# Patient Record
Sex: Male | Born: 1960 | Race: White | Hispanic: Yes | Marital: Married | Smoking: Never smoker
Health system: Southern US, Community
[De-identification: ages and names within clinical notes are randomized; demographics above are authoritative.]

## PROBLEM LIST (undated history)

## (undated) DIAGNOSIS — K219 Gastro-esophageal reflux disease without esophagitis: Secondary | ICD-10-CM

## (undated) HISTORY — DX: Gastro-esophageal reflux disease without esophagitis: K21.9

---

## 2009-12-05 ENCOUNTER — Ambulatory Visit: Payer: Self-pay | Admitting: Internal Medicine

## 2009-12-05 DIAGNOSIS — R1013 Epigastric pain: Secondary | ICD-10-CM

## 2010-03-10 NOTE — Assessment & Plan Note (Signed)
Summary: NEW PT TO ESTABH/DLO   Vital Signs:  Patient profile:   50 year old male Height:      62 inches Weight:      139 pounds BMI:     25.52 Temp:     98.5 degrees F oral Pulse rate:   64 / minute Pulse rhythm:   regular BP sitting:   120 / 60  (left arm) Cuff size:   regular  Vitals Entered By: Mervin Hack CMA Duncan Dull) (December 05, 2009 11:46 AM) CC: new patient to establish care   History of Present Illness: Establishing here I have seen his wife for some time  Has had some sleep problems stress with work and schedule Has to take care of kids in evening and pushes his work later [employer] mild fatigue but no excessive daytime somnolence  Now with some indigestion Intermittent in the past, now more common in past year Gets "empty" feeling frequent light meals Food helps some No history of ulcer ONly occ heartburn (feels it in throat)---hasn't used meds  Preventive Screening-Counseling & Management  Alcohol-Tobacco     Smoking Status: never  Allergies (verified): No Known Drug Allergies  Past History:  Past Medical History: Unremarkable  Past Surgical History: Denies surgical history  Family History: Doesn't know Dad Mom fairly healthy. 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 Eyes:  Denies double vision and vision loss-1 eye. ENT:  Denies decreased hearing and ringing in ears; teeth okay--regular with dentist. CV:  Denies chest pain or discomfort, difficulty breathing at night, difficulty breathing while lying down, fainting, lightheadness, palpitations, and shortness of breath with exertion. Resp:  Complains of cough; denies shortness of breath; occ cough from  drip. GI:  See HPI; Complains of indigestion; denies bloody stools, change in bowel habits, and dark tarry stools. GU:  Denies dysuria, erectile dysfunction, urinary frequency, and urinary hesitancy; occ nocturia. MS:  Denies joint pain and joint swelling. Derm:  Denies lesion(s) and rash. Neuro:  Denies headaches, numbness, tingling, and weakness. Psych:  Denies anxiety and depression. Endo:  Denies cold intolerance, excessive thirst, and excessive urination. Heme:  Denies abnormal bruising and enlarge lymph nodes. Allergy:  Denies seasonal allergies and sneezing.  Physical Exam  General:  alert and normal appearance.   Eyes:  pupils equal, pupils round, pupils reactive to light, and no optic disk abnormalities.   Ears:  R ear normal and L ear normal.   Mouth:  no erythema and no exudates.   Neck:  supple, no masses, no thyromegaly, no carotid bruits, and no cervical lymphadenopathy.   Lungs:  normal respiratory effort, no intercostal retractions, no accessory muscle use, and normal breath sounds.   Heart:  normal rate, regular rhythm, no murmur, and no gallop.   Abdomen:  soft, non-tender, normal bowel sounds, and no masses.   No real tenderness even to deep palpatioin in epigastrium Msk:  no joint tenderness and no joint swelling.   Pulses:  Normal in feet Extremities:  no edema Neurologic:  alert & oriented X3, strength normal in all extremities, and gait normal.   Skin:  no rashes and no suspicious lesions.   Axillary Nodes:  No palpable lymphadenopathy Psych:  normally interactive, good eye contact, not anxious appearing, and not depressed appearing.     Impression & Recommendations:  Problem # 1:  PREVENTIVE HEALTH CARE (ICD-V70.0) Assessment Comment Only Healthy counselling done Tdap  Problem # 2:  ABDOMINAL PAIN, EPIGASTRIC (ICD-789.06) Assessment: New mild and not quite clear cut but some suggestion of possible ulcer will treat with omeprazole for 2-6 weeks check  labs including H. Pylori  Other Orders: Tdap => 26yrs IM (78295) Admin 1st Vaccine (62130)  Patient Instructions: 1)  Please try omeprazole 20mg  daily (at least 30 minutes before eating) for 2-6 weeks 2)  Please get labs to [facility] 3)  CBC with diff, met C, TSH, H. pylori  789.06 4)  Please schedule a follow-up appointment in 1-2 years   Orders Added: 1)  New Patient 40-64 years [99386] 2)  Tdap => 77yrs IM [[id number]] 3)  Admin 1st Vaccine [[id number]]   Immunizations Administered:  Tetanus Vaccine:    Vaccine Type: Tdap    Site: left deltoid    Mfr: GlaxoSmithKline    Dose: 0.5 ml    Route: IM    Given by: Mervin Hack CMA (AAMA)    Exp. Date: 11/28/2011    Lot #: [id number]    VIS given: 12/27/07 version given December 05, 2009.   Immunizations Administered:  Tetanus Vaccine:    Vaccine Type: Tdap    Site: left deltoid    Mfr: GlaxoSmithKline    Dose: 0.5 ml    Route: IM    Given by: Mervin Hack CMA (AAMA)    Exp. Date: 11/28/2011    Lot #: [id number]    VIS given: 12/27/07 version given December 05, 2009.  Prior Medications: None Current Allergies (reviewed today): No known allergies

## 2011-03-12 ENCOUNTER — Encounter: Payer: Self-pay | Admitting: Internal Medicine

## 2011-03-12 ENCOUNTER — Ambulatory Visit (INDEPENDENT_AMBULATORY_CARE_PROVIDER_SITE_OTHER): Payer: 59 | Admitting: Internal Medicine

## 2011-03-12 VITALS — BP 110/60 | HR 57 | Temp 97.6°F | Ht 64.0 in | Wt 136.0 lb

## 2011-03-12 DIAGNOSIS — K219 Gastro-esophageal reflux disease without esophagitis: Secondary | ICD-10-CM

## 2011-03-12 DIAGNOSIS — Z0000 Encounter for general adult medical examination without abnormal findings: Secondary | ICD-10-CM | POA: Insufficient documentation

## 2011-03-12 NOTE — Progress Notes (Signed)
Subjective:    Patient ID: Shane Hanna, male    DOB: Jan 16, 1961, 51 y.o.   MRN: [id number]  HPI Has been doing well He feels his stomach is better Uses zantac prn May have some "feeling" including heartburn---if he eats irregularly  Exercising through [employer] program Trying to eat right  No current outpatient prescriptions on file prior to visit.    No Known Allergies  Past Medical History  Diagnosis Date  . GERD (gastroesophageal reflux disease)     No past surgical history on file.  Family History  Problem Relation Age of Onset  . Coronary artery disease Neg Hx   . Diabetes Neg Hx   . Hypertension Neg Hx   . Cancer Neg Hx     History   Social History  . Marital Status: Married    Spouse Name: N/A    Number of Children: 3  . Years of Education: N/A   Occupational History  . Merchandiser, retail for [employer]   Social History Main Topics  . Smoking status: Never Smoker   . Smokeless tobacco: Never Used  . Alcohol Use: No  . Drug Use: No  . Sexually Active: Not on file   Other Topics Concern  . Not on file   Social History Narrative  . No narrative on file   Review of Systems  Constitutional: Negative for fatigue and unexpected weight change.       Wears seat belt  HENT: Negative for hearing loss, congestion, rhinorrhea, dental problem and tinnitus.        Regular with dentist  Eyes: Negative for visual disturbance.       No diplopia or unilateral vision loss  Respiratory: Positive for cough. Negative for chest tightness and shortness of breath.        Has had some cough---then brief sense of not being able to catch breath---better with sigh  Cardiovascular: Negative for chest pain, palpitations and leg swelling.  Gastrointestinal: Positive for nausea. Negative for vomiting, abdominal pain, constipation and blood in stool.       Occ nausea---associated with reflux  Genitourinary: Negative for dysuria, urgency and difficulty urinating.       No  sexual problems  Musculoskeletal: Negative for back pain, joint swelling and arthralgias.  Skin: Negative for rash.       No suspicious lesions  Neurological: Negative for dizziness, syncope, weakness, light-headedness, numbness and headaches.  Hematological: Negative for adenopathy. Does not bruise/bleed easily.  Psychiatric/Behavioral: Positive for sleep disturbance. Negative for dysphoric mood. The patient is not nervous/anxious.        Some trouble sleeping--relates to work stress. Some trouble initiating and staying asleep. Some daytime fatigue No caffeine       Objective:   Physical Exam  Constitutional: He is oriented to person, place, and time. He appears well-developed and well-nourished. No distress.  HENT:  Head: Normocephalic and atraumatic.  Right Ear: External ear normal.  Left Ear: External ear normal.  Mouth/Throat: Oropharynx is clear and moist. No oropharyngeal exudate.       TMs normal  Eyes: Conjunctivae and EOM are normal. Pupils are equal, round, and reactive to light.  Neck: Normal range of motion. Neck supple. No thyromegaly present.  Cardiovascular: Normal rate, regular rhythm, normal heart sounds and intact distal pulses.  Exam reveals no gallop.   No murmur heard. Pulmonary/Chest: Effort normal and breath sounds normal. No respiratory distress. He has no wheezes. He has no rales.  Abdominal: Soft. There is  no tenderness.  Musculoskeletal: Normal range of motion. He exhibits no edema and no tenderness.  Lymphadenopathy:    He has no cervical adenopathy.  Neurological: He is alert and oriented to person, place, and time.  Skin: No rash noted. No erythema.  Psychiatric: He has a normal mood and affect. His behavior is normal. Judgment and thought content normal.          Assessment & Plan:

## 2011-03-12 NOTE — Assessment & Plan Note (Signed)
Healthy Working on fitness Counseling done Will check lipid and glucose

## 2011-03-12 NOTE — Assessment & Plan Note (Signed)
occ symptoms Ranitidine prn

## 2011-03-13 LAB — LIPID PANEL
HDL: 40 mg/dL (ref >39)
Triglycerides: 203 mg/dL — ABNORMAL HIGH (ref 0–149)

## 2017-07-26 ENCOUNTER — Telehealth: Payer: Self-pay | Admitting: Urgent Care

## 2017-07-26 ENCOUNTER — Ambulatory Visit: Payer: Self-pay | Admitting: Urgent Care

## 2017-07-26 NOTE — Telephone Encounter (Signed)
Pt came in 24 minutes past his appt time. He told me to reschedule it but left before I could reschedule it.

## 2017-10-31 ENCOUNTER — Other Ambulatory Visit: Payer: Self-pay | Admitting: Family Medicine

## 2017-10-31 DIAGNOSIS — N62 Hypertrophy of breast: Secondary | ICD-10-CM

## 2017-10-31 DIAGNOSIS — D171 Benign lipomatous neoplasm of skin and subcutaneous tissue of trunk: Secondary | ICD-10-CM

## 2017-11-10 ENCOUNTER — Other Ambulatory Visit: Payer: BLUE CROSS/BLUE SHIELD

## 2017-11-30 ENCOUNTER — Encounter: Payer: Self-pay | Admitting: Internal Medicine

## 2017-12-27 ENCOUNTER — Ambulatory Visit: Payer: Self-pay | Admitting: Internal Medicine

## 2018-07-24 ENCOUNTER — Other Ambulatory Visit: Payer: Self-pay | Admitting: Surgery

## 2018-07-24 DIAGNOSIS — N62 Hypertrophy of breast: Secondary | ICD-10-CM

## 2018-07-28 ENCOUNTER — Ambulatory Visit
Admission: RE | Admit: 2018-07-28 | Discharge: 2018-07-28 | Disposition: A | Discharge status: Home / Self Care | Payer: BC Managed Care – PPO | Source: Ambulatory Visit | Attending: Surgery | Admitting: Surgery

## 2018-07-28 ENCOUNTER — Other Ambulatory Visit: Payer: Self-pay

## 2018-07-28 DIAGNOSIS — N62 Hypertrophy of breast: Secondary | ICD-10-CM | POA: Diagnosis not present

## 2018-07-31 ENCOUNTER — Ambulatory Visit: Payer: Self-pay | Admitting: Surgery

## 2018-07-31 NOTE — H&P (Signed)
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 male who was referred by Toni Arthurs,* for evaluation of above. First noted several months ago.  Symptoms include: Pain is intermittent, with palpation.  Exacerbated by palpation.  Alleviated by nothing specific.  Associated with visible enlargement.  First noticed gynecomastia after losing some weight.  Endocrine workup negative, and tx with tamoxifen has relieved some, but not all pain.  The chest wall lipoma was noted after dropping a dumbbell in the area.  No overlying skin changes, but is tender on palpation.    Denies any testicular mass, lesion, or pain.   Past Medical History:  has a past medical history of Colon polyp and Gynecomastia.  Past Surgical History:  has a past surgical history that includes wisdom teeth removal (1987) and Colonoscopy (12/14/2017).  Family History: family history includes Myocardial Infarction (Heart attack) in his maternal grandfather; No Known Problems in his brother; Urinary tract infection in his mother.  Social History:  reports that he has never smoked. He has never used smokeless tobacco. He reports that he does not drink alcohol or use drugs.  Current Medications: has a current medication list which includes the following prescription(s): multivitamin  Allergies:  No Known Allergies  ROS:  A 15 point review of systems was performed and pertinent positives and negatives noted in HPI   Objective:   BP 124/75   Pulse (!) 49   Ht 157.5 cm (5\' 2" )   Wt 66.7 kg (147 lb)   BMI 26.89 kg/m   Constitutional :  alert, appears stated age, cooperative and no distress  Lymphatics/Throat:  no asymmetry, masses, or scars  Respiratory:  clear to auscultation bilaterally  Cardiovascular:  regular rate and rhythm  Gastrointestinal: soft, non-tender; bowel sounds normal; no masses,  no  organomegaly.  Easily visible, small incarcerated, but non-tender umbilical hernia  Musculoskeletal: Steady gait and movement  Skin: Cool and moist  Psychiatric: Normal affect, non-agitated, not confused  Breast: Bilatera, moderate size gynecomastia with no concerning palpable lumps, no overlying skin changes.  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 Gynecomastia, male [N62] Discussed surgical excision.  Alternatives include continued observation.  Benefits include possible symptom relief, pathologic evaluation, improved cosmesis. Discussed the risk of surgery including recurrence, chronic pain, post-op infxn, poor cosmesis, poor/delayed wound healing, and possible re-operation to address said risks. The risks of general anesthetic, if used, includes MI, CVA, sudden death or even reaction to anesthetic medications also discussed.  Typical post-op recovery time of 3-5 days with possible activity restrictions were also discussed.  The patient verbalized understanding and all questions were answered to the patient's satisfaction.  2. Patient has elected to proceed with surgical treatment, understanding this is for symptoms relief and no guarantee of cosmetic outcome.   Procedure will be scheduled after mammogram done and confirm no suspicious lesions.  Will perform lipoma removal at same time.  3.  Discussed umbilical hernia pathology and possible surgical intervention.  Will discuss in detail again once recovered from surgery above.  Written consent was obtained.     Electronically signed by Benjamine Sprague, DO on 07/24/2018 9:53 AM

## 2018-07-31 NOTE — H&P (View-Only) (Signed)
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 male who was referred by Toni Arthurs,* for evaluation of above. First noted several months ago.  Symptoms include: Pain is intermittent, with palpation.  Exacerbated by palpation.  Alleviated by nothing specific.  Associated with visible enlargement.  First noticed gynecomastia after losing some weight.  Endocrine workup negative, and tx with tamoxifen has relieved some, but not all pain.  The chest wall lipoma was noted after dropping a dumbbell in the area.  No overlying skin changes, but is tender on palpation.    Denies any testicular mass, lesion, or pain.   Past Medical History:  has a past medical history of Colon polyp and Gynecomastia.  Past Surgical History:  has a past surgical history that includes wisdom teeth removal (1987) and Colonoscopy (12/14/2017).  Family History: family history includes Myocardial Infarction (Heart attack) in his maternal grandfather; No Known Problems in his brother; Urinary tract infection in his mother.  Social History:  reports that he has never smoked. He has never used smokeless tobacco. He reports that he does not drink alcohol or use drugs.  Current Medications: has a current medication list which includes the following prescription(s): multivitamin  Allergies:  No Known Allergies  ROS:  A 15 point review of systems was performed and pertinent positives and negatives noted in HPI   Objective:   BP 124/75   Pulse (!) 49   Ht 157.5 cm (5\' 2" )   Wt 66.7 kg (147 lb)   BMI 26.89 kg/m   Constitutional :  alert, appears stated age, cooperative and no distress  Lymphatics/Throat:  no asymmetry, masses, or scars  Respiratory:  clear to auscultation bilaterally  Cardiovascular:  regular rate and rhythm  Gastrointestinal: soft, non-tender; bowel sounds normal; no masses,  no  organomegaly.  Easily visible, small incarcerated, but non-tender umbilical hernia  Musculoskeletal: Steady gait and movement  Skin: Cool and moist  Psychiatric: Normal affect, non-agitated, not confused  Breast: Bilatera, moderate size gynecomastia with no concerning palpable lumps, no overlying skin changes.  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 Gynecomastia, male [N62] Discussed surgical excision.  Alternatives include continued observation.  Benefits include possible symptom relief, pathologic evaluation, improved cosmesis. Discussed the risk of surgery including recurrence, chronic pain, post-op infxn, poor cosmesis, poor/delayed wound healing, and possible re-operation to address said risks. The risks of general anesthetic, if used, includes MI, CVA, sudden death or even reaction to anesthetic medications also discussed.  Typical post-op recovery time of 3-5 days with possible activity restrictions were also discussed.  The patient verbalized understanding and all questions were answered to the patient's satisfaction.  2. Patient has elected to proceed with surgical treatment, understanding this is for symptoms relief and no guarantee of cosmetic outcome.   Procedure will be scheduled after mammogram done and confirm no suspicious lesions.  Will perform lipoma removal at same time.  3.  Discussed umbilical hernia pathology and possible surgical intervention.  Will discuss in detail again once recovered from surgery above.  Written consent was obtained.     Electronically signed by Benjamine Sprague, DO on 07/24/2018 9:53 AM

## 2018-08-02 ENCOUNTER — Other Ambulatory Visit: Payer: Self-pay

## 2018-08-02 ENCOUNTER — Encounter
Admission: RE | Admit: 2018-08-02 | Discharge: 2018-08-02 | Disposition: A | Discharge status: Home / Self Care | Payer: BC Managed Care – PPO | Source: Ambulatory Visit | Attending: Surgery | Admitting: Surgery

## 2018-08-02 NOTE — Patient Instructions (Signed)
Your procedure is scheduled on:08/08/18 Report to Day Surgery. [facility]OND [facility]LOOR To find out your arrival time please call [phone] between 1PM - 3PM on  08/07/18.  Remember: Instructions that are not followed completely may result in serious medical risk,  up to and including death, or upon the discretion of your surgeon and anesthesiologist your  surgery may need to be rescheduled.     _X__ 1. Do not eat food after midnight the night before your procedure.                 No gum chewing or hard candies. You may drink clear liquids up to 2 hours                 before you are scheduled to arrive for your surgery- DO not drink clear                 liquids within 2 hours of the start of your surgery.                 Clear Liquids include:  water, apple juice without pulp, clear carbohydrate                 drink such as Clearfast of Gatorade, Black Coffee or Tea (Do not add                 anything to coffee or tea).  __X__2.  On the morning of surgery brush your teeth with toothpaste and water, you                may rinse your mouth with mouthwash if you wish.  Do not swallow any toothpaste of mouthwash.     _X__ 3.  No Alcohol for 24 hours before or after surgery.   _X__ 4.  Do Not Smoke or use e-cigarettes For 24 Hours Prior to Your Surgery.                 Do not use any chewable tobacco products for at least 6 hours prior to                 surgery.  ____  5.  Bring all medications with you on the day of surgery if instructed.   __X__  6.  Notify your doctor if there is any change in your medical condition      (cold, fever, infections).     Do not wear jewelry, make-up, hairpins, clips or nail polish. Do not wear lotions, powders, or perfumes. You may wear deodorant. Do not shave 48 hours prior to surgery. Men may shave face and neck. Do not bring valuables to the hospital.    [facility] is not responsible for any belongings or  valuables.  Contacts, dentures or bridgework may not be worn into surgery. Leave your suitcase in the car. After surgery it may be brought to your room. For patients admitted to the hospital, discharge time is determined by your treatment team.   Patients discharged the day of surgery will not be allowed to driVE           ____ Take these medicines the morning of surgery with A SIP OF WATER:    1. NONE  2.   3.   4.  5.  6.  ____ Fleet Enema (as directed)   ____ Use CHG Soap as directed  ____ Use inhalers on the day of surgery  ____ Stop metformin  2 days prior to surgery    ____ Take 1/2 of usual insulin dose the night before surgery. No insulin the morning          of surgery.   ____ Stop Coumadin/Plavix/aspirin on   ____ Stop Anti-inflammatories on    ____ Stop supplements until after surgery.    ____ Bring C-Pap to the hospital.

## 2018-08-04 ENCOUNTER — Other Ambulatory Visit
Admission: RE | Admit: 2018-08-04 | Discharge: 2018-08-04 | Disposition: A | Discharge status: Home / Self Care | Payer: BC Managed Care – PPO | Source: Ambulatory Visit | Attending: Surgery | Admitting: Surgery

## 2018-08-04 ENCOUNTER — Other Ambulatory Visit: Payer: Self-pay

## 2018-08-04 DIAGNOSIS — Z1159 Encounter for screening for other viral diseases: Secondary | ICD-10-CM | POA: Diagnosis not present

## 2018-08-05 LAB — NOVEL CORONAVIRUS, NAA (HOSP ORDER, SEND-OUT TO REF LAB; TAT 18-24 HRS): SARS-CoV-2, NAA: NOT DETECTED

## 2018-08-07 ENCOUNTER — Encounter: Payer: Self-pay | Admitting: Anesthesiology

## 2018-08-07 MED ORDER — CEFAZOLIN SODIUM-DEXTROSE 2-4 GM/100ML-% IV SOLN
2.0000 g | INTRAVENOUS | Status: AC
  Administered 2018-08-08: 2 g via INTRAVENOUS

## 2018-08-08 ENCOUNTER — Ambulatory Visit: Payer: BC Managed Care – PPO | Admitting: Anesthesiology

## 2018-08-08 ENCOUNTER — Encounter: Payer: Self-pay | Admitting: *Deleted

## 2018-08-08 ENCOUNTER — Ambulatory Visit
Admission: RE | Admit: 2018-08-08 | Discharge: 2018-08-08 | Disposition: A | Discharge status: Home / Self Care | Payer: BC Managed Care – PPO | Source: Ambulatory Visit | Attending: Surgery | Admitting: Surgery

## 2018-08-08 ENCOUNTER — Other Ambulatory Visit: Payer: Self-pay

## 2018-08-08 ENCOUNTER — Encounter
Admission: RE | Disposition: A | Discharge status: Home / Self Care | Payer: Self-pay | Source: Ambulatory Visit | Attending: Surgery

## 2018-08-08 DIAGNOSIS — D171 Benign lipomatous neoplasm of skin and subcutaneous tissue of trunk: Secondary | ICD-10-CM | POA: Insufficient documentation

## 2018-08-08 DIAGNOSIS — K420 Umbilical hernia with obstruction, without gangrene: Secondary | ICD-10-CM | POA: Diagnosis not present

## 2018-08-08 DIAGNOSIS — N62 Hypertrophy of breast: Secondary | ICD-10-CM | POA: Diagnosis not present

## 2018-08-08 HISTORY — PX: GYNECOMASTIA MASTECTOMY: SHX5265

## 2018-08-08 HISTORY — PX: LIPOMA EXCISION: SHX5283

## 2018-08-08 SURGERY — MASTECTOMY, FOR GYNECOMASTIA
Anesthesia: General | Laterality: Bilateral

## 2018-08-08 MED ORDER — HYDROCODONE-ACETAMINOPHEN 5-325 MG PO TABS
1.0000 | ORAL_TABLET | Freq: Once | ORAL | Status: AC
  Administered 2018-08-08: 14:00:00 1 via ORAL

## 2018-08-08 MED ORDER — HYDROCODONE-ACETAMINOPHEN 5-325 MG PO TABS: 1.0000 | ORAL_TABLET | Freq: Four times a day (QID) | ORAL | 0 refills | Status: AC | PRN

## 2018-08-08 MED ORDER — CELECOXIB 200 MG PO CAPS
ORAL_CAPSULE | ORAL | Status: AC
  Administered 2018-08-08: 09:00:00 200 mg via ORAL
  Filled 2018-08-08: qty 1

## 2018-08-08 MED ORDER — ACETAMINOPHEN 500 MG PO TABS
1000.0000 mg | ORAL_TABLET | ORAL | Status: AC
  Administered 2018-08-08: 09:00:00 1000 mg via ORAL

## 2018-08-08 MED ORDER — ACETAMINOPHEN 500 MG PO TABS
ORAL_TABLET | ORAL | Status: AC
  Administered 2018-08-08: 1000 mg via ORAL
  Filled 2018-08-08: qty 2

## 2018-08-08 MED ORDER — GABAPENTIN 300 MG PO CAPS
ORAL_CAPSULE | ORAL | Status: AC
  Administered 2018-08-08: 09:00:00 300 mg via ORAL
  Filled 2018-08-08: qty 1

## 2018-08-08 MED ORDER — IBUPROFEN 800 MG PO TABS: 800.0000 mg | ORAL_TABLET | Freq: Three times a day (TID) | ORAL | 0 refills | Status: DC | PRN

## 2018-08-08 MED ORDER — FENTANYL CITRATE (PF) 100 MCG/2ML IJ SOLN: 25.0000 ug | INTRAMUSCULAR | Status: DC | PRN

## 2018-08-08 MED ORDER — GABAPENTIN 300 MG PO CAPS
300.0000 mg | ORAL_CAPSULE | ORAL | Status: AC
  Administered 2018-08-08: 09:00:00 300 mg via ORAL

## 2018-08-08 MED ORDER — FAMOTIDINE 20 MG PO TABS
20.0000 mg | ORAL_TABLET | Freq: Once | ORAL | Status: AC
  Administered 2018-08-08: 09:00:00 20 mg via ORAL

## 2018-08-08 MED ORDER — MIDAZOLAM HCL 2 MG/2ML IJ SOLN
INTRAMUSCULAR | Status: AC
  Filled 2018-08-08: qty 2

## 2018-08-08 MED ORDER — LIDOCAINE HCL (PF) 1 % IJ SOLN
INTRAMUSCULAR | Status: AC
  Filled 2018-08-08: qty 30

## 2018-08-08 MED ORDER — BUPIVACAINE-EPINEPHRINE (PF) 0.5% -1:200000 IJ SOLN
INTRAMUSCULAR | Status: AC
  Filled 2018-08-08: qty 30

## 2018-08-08 MED ORDER — FENTANYL CITRATE (PF) 100 MCG/2ML IJ SOLN
INTRAMUSCULAR | Status: DC | PRN
  Administered 2018-08-08 (×4): 25 ug via INTRAVENOUS

## 2018-08-08 MED ORDER — GLYCOPYRROLATE 0.2 MG/ML IJ SOLN
INTRAMUSCULAR | Status: AC
  Filled 2018-08-08: qty 1

## 2018-08-08 MED ORDER — PROPOFOL 10 MG/ML IV BOLUS
INTRAVENOUS | Status: AC
  Filled 2018-08-08: qty 20

## 2018-08-08 MED ORDER — ONDANSETRON HCL 4 MG/2ML IJ SOLN
INTRAMUSCULAR | Status: DC | PRN
  Administered 2018-08-08: 4 mg via INTRAVENOUS

## 2018-08-08 MED ORDER — MIDAZOLAM HCL 2 MG/2ML IJ SOLN
INTRAMUSCULAR | Status: DC | PRN
  Administered 2018-08-08: 2 mg via INTRAVENOUS

## 2018-08-08 MED ORDER — HYDROCODONE-ACETAMINOPHEN 5-325 MG PO TABS
ORAL_TABLET | ORAL | Status: AC
  Administered 2018-08-08: 1 via ORAL
  Filled 2018-08-08: qty 1

## 2018-08-08 MED ORDER — LACTATED RINGERS IV SOLN
INTRAVENOUS | Status: DC
  Administered 2018-08-08 (×2): via INTRAVENOUS

## 2018-08-08 MED ORDER — CELECOXIB 200 MG PO CAPS
200.0000 mg | ORAL_CAPSULE | ORAL | Status: AC
  Administered 2018-08-08: 09:00:00 200 mg via ORAL

## 2018-08-08 MED ORDER — CEFAZOLIN SODIUM-DEXTROSE 2-4 GM/100ML-% IV SOLN
INTRAVENOUS | Status: AC
  Filled 2018-08-08: qty 100

## 2018-08-08 MED ORDER — BUPIVACAINE HCL (PF) 0.5 % IJ SOLN
INTRAMUSCULAR | Status: DC | PRN
  Administered 2018-08-08: 12.5 mL

## 2018-08-08 MED ORDER — DOCUSATE SODIUM 100 MG PO CAPS: 100.0000 mg | ORAL_CAPSULE | Freq: Two times a day (BID) | ORAL | 0 refills | Status: AC | PRN

## 2018-08-08 MED ORDER — LIDOCAINE HCL 1 % IJ SOLN
INTRAMUSCULAR | Status: DC | PRN
  Administered 2018-08-08: 12.5 mL

## 2018-08-08 MED ORDER — ONDANSETRON HCL 4 MG/2ML IJ SOLN: 4.0000 mg | Freq: Once | INTRAMUSCULAR | Status: DC | PRN

## 2018-08-08 MED ORDER — FENTANYL CITRATE (PF) 100 MCG/2ML IJ SOLN
INTRAMUSCULAR | Status: AC
  Filled 2018-08-08: qty 2

## 2018-08-08 MED ORDER — LIDOCAINE-EPINEPHRINE (PF) 1 %-1:200000 IJ SOLN
INTRAMUSCULAR | Status: AC
  Filled 2018-08-08: qty 30

## 2018-08-08 MED ORDER — LIDOCAINE HCL (CARDIAC) PF 100 MG/5ML IV SOSY
PREFILLED_SYRINGE | INTRAVENOUS | Status: DC | PRN
  Administered 2018-08-08: 60 mg via INTRAVENOUS

## 2018-08-08 MED ORDER — FAMOTIDINE 20 MG PO TABS
ORAL_TABLET | ORAL | Status: AC
  Administered 2018-08-08: 20 mg via ORAL
  Filled 2018-08-08: qty 1

## 2018-08-08 MED ORDER — GLYCOPYRROLATE 0.2 MG/ML IJ SOLN
INTRAMUSCULAR | Status: DC | PRN
  Administered 2018-08-08 (×2): 0.2 mg via INTRAVENOUS

## 2018-08-08 MED ORDER — ONDANSETRON HCL 4 MG/2ML IJ SOLN
INTRAMUSCULAR | Status: AC
  Filled 2018-08-08: qty 2

## 2018-08-08 MED ORDER — ACETAMINOPHEN 325 MG PO TABS: 650.0000 mg | ORAL_TABLET | Freq: Three times a day (TID) | ORAL | 0 refills | Status: AC | PRN

## 2018-08-08 MED ORDER — CHLORHEXIDINE GLUCONATE CLOTH 2 % EX PADS: 6.0000 | MEDICATED_PAD | Freq: Once | CUTANEOUS | Status: DC

## 2018-08-08 MED ORDER — PROPOFOL 10 MG/ML IV BOLUS
INTRAVENOUS | Status: DC | PRN
  Administered 2018-08-08: 80 mg via INTRAVENOUS
  Administered 2018-08-08: 120 mg via INTRAVENOUS

## 2018-08-08 SURGICAL SUPPLY — 46 items
APPLIER CLIP 11 MED OPEN (CLIP)
BLADE SURG 15 STRL LF DISP TIS (BLADE) ×1 IMPLANT
BLADE SURG 15 STRL SS (BLADE) ×2
BULB RESERV EVAC DRAIN JP 100C (MISCELLANEOUS) IMPLANT
CANISTER SUCT 1200ML W/VALVE (MISCELLANEOUS) ×3 IMPLANT
CHLORAPREP W/TINT 26 (MISCELLANEOUS) ×3 IMPLANT
CLIP APPLIE 11 MED OPEN (CLIP) IMPLANT
COVER WAND RF STERILE (DRAPES) ×3 IMPLANT
DERMABOND ADVANCED (GAUZE/BANDAGES/DRESSINGS) ×6
DERMABOND ADVANCED .7 DNX12 (GAUZE/BANDAGES/DRESSINGS) ×1 IMPLANT
DRAIN CHANNEL JP 15F RND 16 (MISCELLANEOUS) IMPLANT
DRAPE LAPAROTOMY 100X77 ABD (DRAPES) ×1 IMPLANT
DRAPE LAPAROTOMY TRNSV 106X77 (MISCELLANEOUS) ×3 IMPLANT
DRAPE SHEET LG 3/4 BI-LAMINATE (DRAPES) ×3 IMPLANT
ELECT CAUTERY BLADE 6.4 (BLADE) ×3 IMPLANT
ELECT CAUTERY BLADE TIP 2.5 (TIP) ×3
ELECT REM PT RETURN 9FT ADLT (ELECTROSURGICAL) ×3
ELECTRODE CAUTERY BLDE TIP 2.5 (TIP) ×1 IMPLANT
ELECTRODE REM PT RTRN 9FT ADLT (ELECTROSURGICAL) ×1 IMPLANT
GLOVE BIOGEL PI IND STRL 7.0 (GLOVE) ×1 IMPLANT
GLOVE BIOGEL PI INDICATOR 7.0 (GLOVE) ×2
GLOVE SURG SYN 6.5 ES PF (GLOVE) ×3 IMPLANT
GLOVE SURG SYN 6.5 PF PI (GLOVE) ×1 IMPLANT
GOWN STRL REUS W/ TWL LRG LVL3 (GOWN DISPOSABLE) ×2 IMPLANT
GOWN STRL REUS W/TWL LRG LVL3 (GOWN DISPOSABLE) ×4
KIT TURNOVER KIT A (KITS) ×3 IMPLANT
LABEL OR SOLS (LABEL) ×3 IMPLANT
LIGHT WAVEGUIDE WIDE FLAT (MISCELLANEOUS) IMPLANT
NEEDLE HYPO 22GX1.5 SAFETY (NEEDLE) ×3 IMPLANT
NS IRRIG 1000ML POUR BTL (IV SOLUTION) ×3 IMPLANT
PACK BASIN MINOR ARMC (MISCELLANEOUS) ×3 IMPLANT
SPONGE LAP 18X18 RF (DISPOSABLE) ×3 IMPLANT
SUT ETHILON 3-0 FS-10 30 BLK (SUTURE)
SUT MNCRL 4-0 (SUTURE) ×2
SUT MNCRL 4-0 27XMFL (SUTURE) ×1
SUT SILK 2 0 (SUTURE) ×2
SUT SILK 2 0 SH (SUTURE) IMPLANT
SUT SILK 2-0 30XBRD TIE 12 (SUTURE) ×1 IMPLANT
SUT SILK 3 0 12 30 (SUTURE) ×3 IMPLANT
SUT VIC AB 3-0 SH 27 (SUTURE) ×2
SUT VIC AB 3-0 SH 27X BRD (SUTURE) ×1 IMPLANT
SUTURE EHLN 3-0 FS-10 30 BLK (SUTURE) IMPLANT
SUTURE MNCRL 4-0 27XMF (SUTURE) ×1 IMPLANT
SYR 20CC LL (SYRINGE) ×3 IMPLANT
SYR 30ML LL (SYRINGE) ×3 IMPLANT
TOWEL OR 17X26 4PK STRL BLUE (TOWEL DISPOSABLE) ×3 IMPLANT

## 2018-08-08 NOTE — Anesthesia Preprocedure Evaluation (Signed)
Anesthesia Evaluation  Patient identified by MRN, date of birth, ID band Patient awake    Reviewed: Allergy & Precautions, NPO status , Patient's Chart, lab work & pertinent test results, reviewed documented beta blocker date and time   Airway Mallampati: III  TM Distance: >3 FB     Dental  (+) Chipped   Pulmonary           Cardiovascular      Neuro/Psych    GI/Hepatic GERD  Controlled,  Endo/Other    Renal/GU      Musculoskeletal   Abdominal   Peds  Hematology   Anesthesia Other Findings   Reproductive/Obstetrics                             Anesthesia Physical Anesthesia Plan  ASA: III  Anesthesia Plan: General   Post-op Pain Management:    Induction: Intravenous  PONV Risk Score and Plan:   Airway Management Planned: Oral ETT and LMA  Additional Equipment:   Intra-op Plan:   Post-operative Plan:   Informed Consent: I have reviewed the patients History and Physical, chart, labs and discussed the procedure including the risks, benefits and alternatives for the proposed anesthesia with the patient or authorized representative who has indicated his/her understanding and acceptance.       Plan Discussed with: CRNA  Anesthesia Plan Comments:         Anesthesia Quick Evaluation

## 2018-08-08 NOTE — Interval H&P Note (Signed)
History and Physical Interval Note:  08/08/2018 5:24 PM  Shane Hanna  has presented today for surgery, with the diagnosis of N62 BILATERAL GYNECOMASTIA D17.1 LIPOMA OF ANTERIOR CHEST WALL.  The various methods of treatment have been discussed with the patient and family. After consideration of risks, benefits and other options for treatment, the patient has consented to  Procedure(s): MASTECTOMY GYNECOMASTIA (Bilateral) EXCISION LIPOMA LEFT CHEST WALL BILATERAL (Bilateral) as a surgical intervention.  The patient's history has been reviewed, patient examined, no change in status, stable for surgery.  I have reviewed the patient's chart and labs.  Questions were answered to the patient's satisfaction.     [name] Lysle Pearl

## 2018-08-08 NOTE — Anesthesia Postprocedure Evaluation (Signed)
Anesthesia Post Note  Patient: Shane Hanna  Procedure(s) Performed: MASTECTOMY GYNECOMASTIA (Bilateral ) EXCISION LIPOMA LEFT CHEST WALL BILATERAL (Bilateral )  Patient location during evaluation: PACU Anesthesia Type: General Level of consciousness: awake and alert Pain management: pain level controlled Vital Signs Assessment: post-procedure vital signs reviewed and stable Respiratory status: spontaneous breathing, nonlabored ventilation, respiratory function stable and patient connected to nasal cannula oxygen Cardiovascular status: blood pressure returned to baseline and stable Postop Assessment: no apparent nausea or vomiting Anesthetic complications: no     Last Vitals:  Vitals:   08/08/18 1250 08/08/18 1305  BP: 134/72   Pulse: (!) 46 (!) 48  Resp: 12   Temp:  (!) 35.8 C  SpO2: 98%     Last Pain:  Vitals:   08/08/18 1305  TempSrc:   PainSc: 0-No pain                 Alexandera Kuntzman S

## 2018-08-08 NOTE — Discharge Instructions (Signed)
Patient can remove bandage when he gets home and place a compression shirt on then.  Gynecomastia and chest wall mass biopsy, Care After This sheet gives you information about how to care for yourself after your procedure. Your health care provider may also give you more specific instructions. If you have problems or questions, contact your health care provider. What can I expect after the procedure? After the procedure, it is common to have:  Soreness.  Bruising.  Itching. Follow these instructions at home: Biopsy site care Follow instructions from your health care provider about how to take care of your biopsy site. Make sure you:  Wash your hands with soap and water before and after you change your bandage (dressing). If soap and water are not available, use hand sanitizer.  Leave stitches (sutures), skin glue, or adhesive strips in place. These skin closures may need to stay in place for 2 weeks or longer. If adhesive strip edges start to loosen and curl up, you may trim the loose edges. Do not remove adhesive strips completely unless your health care provider tells you to do that.  If the biopsy area bleeds or bruises, apply gentle pressure for 10 minutes.  OK TO SHOWER IN 24HRS Check your biopsy site every day for signs of infection. Check for:  Redness, swelling, or pain.  Fluid or blood.  Warmth.  Pus or a bad smell.  General instructions  Rest and then return to your normal activities as told by your health care provider.   tylenol and advil as needed for discomfort.  Please alternate between the two every four hours as needed for pain.     Use narcotics, if prescribed, only when tylenol and motrin is not enough to control pain.   325-650mg  every 8hrs to max of 3000mg /24hrs (including the 325mg  in every norco dose) for the tylenol.     Advil up to 800mg  per dose every 8hrs as needed for pain.    Keep all follow-up visits as told by your health care provider. This  is important. Contact a health care provider if:  You have redness, swelling, or pain around your biopsy site.  You have fluid or blood coming from your biopsy site.  Your biopsy site feels warm to the touch.  You have pus or a bad smell coming from your biopsy site.  You have a fever.  Your sutures, skin glue, or adhesive strips loosen or come off sooner than expected. Get help right away if:  You have bleeding that does not stop with pressure or a dressing. Summary  After the procedure, it is common to have some soreness, bruising, and itching at the site.  Follow instructions from your health care provider about how to take care of your biopsy site.  Check your biopsy site every day for signs of infection.  Contact a health care provider if you have redness, swelling, or pain around your biopsy site, or your biopsy site feels warm to the touch.  Keep all follow-up visits as told by your health care provider. This is important. This information is not intended to replace advice given to you by your health care provider. Make sure you discuss any questions you have with your health care provider. Document Released: 02/21/2015 Document Revised: 07/25/2017 Document Reviewed: 07/25/2017 Elsevier Interactive Patient Education  2019 [place]   1) The drugs that you were given will stay in your system until tomorrow so for the  next 24 hours you should not:  A) Drive an automobile B) Make any legal decisions C) Drink any alcoholic beverage   2) You may resume regular meals tomorrow.  Today it is better to start with liquids and gradually work up to solid foods.  You may eat anything you prefer, but it is better to start with liquids, then soup and crackers, and gradually work up to solid foods.   3) Please notify your doctor immediately if you have any unusual bleeding, trouble breathing, redness and pain at the surgery  site, drainage, fever, or pain not relieved by medication.    4) Additional Instructions:        Please contact your physician with any problems or Same Day Surgery at [phone], Monday through Friday 6 am to 4 pm, or [place] at [facility] number at [phone].

## 2018-08-08 NOTE — Transfer of Care (Signed)
Immediate Anesthesia Transfer of Care Note  Patient: Shane Hanna  Procedure(s) Performed: MASTECTOMY GYNECOMASTIA (Bilateral ) EXCISION LIPOMA LEFT CHEST WALL BILATERAL (Bilateral )  Patient Location: PACU  Anesthesia Type:General  Level of Consciousness: awake, alert  and oriented  Airway & Oxygen Therapy: Patient Spontanous Breathing and Patient connected to face mask oxygen  Post-op Assessment: Report given to RN and Post -op Vital signs reviewed and stable  Post vital signs: Reviewed and stable  Last Vitals:  Vitals Value Taken Time  BP    Temp    Pulse 55 08/08/18 1206  Resp    SpO2 100 % 08/08/18 1206  Vitals shown include unvalidated device data.  Last Pain:  Vitals:   08/08/18 0816  TempSrc: Temporal  PainSc: 0-No pain         Complications: No apparent anesthesia complications

## 2018-08-08 NOTE — Anesthesia Post-op Follow-up Note (Signed)
Anesthesia QCDR form completed.        

## 2018-08-09 NOTE — Op Note (Addendum)
Pre-Op Dx: Bilateral gynecomastia, and left chest wall lipoma Post-Op Dx: same Anesthesia: General Surgeon: Lysle Pearl EBL: minimal Complications:  none apparent Specimen: Left gynecomastia tissue, left chest wall lipoma, right gynecomastia tissue Procedure: excisional biopsy of bilateral gynecomastia, and left chest wall mass  Indication for procedure: 58 year old male presenting with symptomatic bilateral gynecomastia, and a left chest wall lipoma.  Please see HPI for further details.  Description of Procedure:  Consent obtained, time out performed.  Patient transferred to the OR table and placed in supine position.  Area sterilized and draped in usual position.  Preop antibiotics given, and general anesthesia induced.  Local infused to area previously marked around left chest lipoma.  4 cm incision made through dermis with 15blade and lipoma noted in deep subcutaneous layer with some fascial involvement.  The  5.5 x 5 x 3 cm lipoma then removed from surrounding tissue completely using electrocautery, passed off field pending pathology.  Wound hemostasis noted.  Attention was then turned to the left gynecomastia tissue.  Local infused around the previously marked tissue, and a periareolar incision was made to expose the breast tissue.  Skin flaps were then created under the nipple areolar complex and surrounding tissue until the excess breast tissue was able to be dissected completely free from its surrounding tissue and removed.  Care was noted to ensure that the skin flaps were of adequate thickness to maintain circulation.  The specimen was passed off the field pending pathology.  Adequate hemostasis was noted afterwards.  Attention was then turned to the right gynecomastia tissue.  Local infused around the previously marked tissue, and a periareolar incision was made to expose the breast tissue.  Skin flaps were then created under the nipple areolar complex and surrounding tissue until the excess  breast tissue was able to be dissected completely free from its surrounding tissue and removed.  Care was noted to ensure that the skin flaps were of adequate thickness to maintain circulation.  The specimen was passed off the field pending pathology.  Adequate hemostasis was noted afterwards  All incision sites then were extensively irrigated one last time, before closing with 3 -0 vicryl in interrupted fashion for deep dermal layer, then running 4-0 monocryl in subcuticular fashion for epidermal layer.  Wounds then dressed with dermabond.  Pt tolerated procedure well, and transferred to PACU in stable condition. Sponge and instrument count correct at end of procedure.

## 2018-08-10 LAB — SURGICAL PATHOLOGY

## 2020-02-16 ENCOUNTER — Other Ambulatory Visit: Payer: Self-pay

## 2020-02-16 ENCOUNTER — Other Ambulatory Visit: Payer: BC Managed Care – PPO

## 2020-02-16 DIAGNOSIS — Z20822 Contact with and (suspected) exposure to covid-19: Secondary | ICD-10-CM

## 2020-02-19 LAB — NOVEL CORONAVIRUS, NAA: SARS-CoV-2, NAA: DETECTED — AB

## 2020-02-20 ENCOUNTER — Telehealth: Payer: Self-pay | Admitting: *Deleted

## 2020-02-20 NOTE — Telephone Encounter (Signed)
Called to discuss with patient about COVID-19 symptoms and the use of one of the available treatments for those with mild to moderate Covid symptoms and at a high risk of hospitalization.  Pt appears to qualify for outpatient treatment due to co-morbid conditions and/or a member of an at-risk group in accordance with the FDA Emergency Use Authorization.    Symptom onset? Booster?  Immunocompromised?  Qualifiers:  Unable to reach pt - No answer. Left VM to return call.  Tarry Kos

## 2020-04-06 IMAGING — US ULTRASOUND LEFT BREAST LIMITED
1 series · 5 of 5 positions shown · non-contrast
Comparison: None.

CLINICAL DATA: 57-year-old male complains of a palpable abnormality
in the left breast.

EXAM:
DIGITAL DIAGNOSTIC BILATERAL MAMMOGRAM WITH CAD AND TOMO
ULTRASOUND LEFT BREAST

[Series 1: ultrasound left breast limited · 0.07mm/px · 5 of 5 slices shown]
[im 1/5]
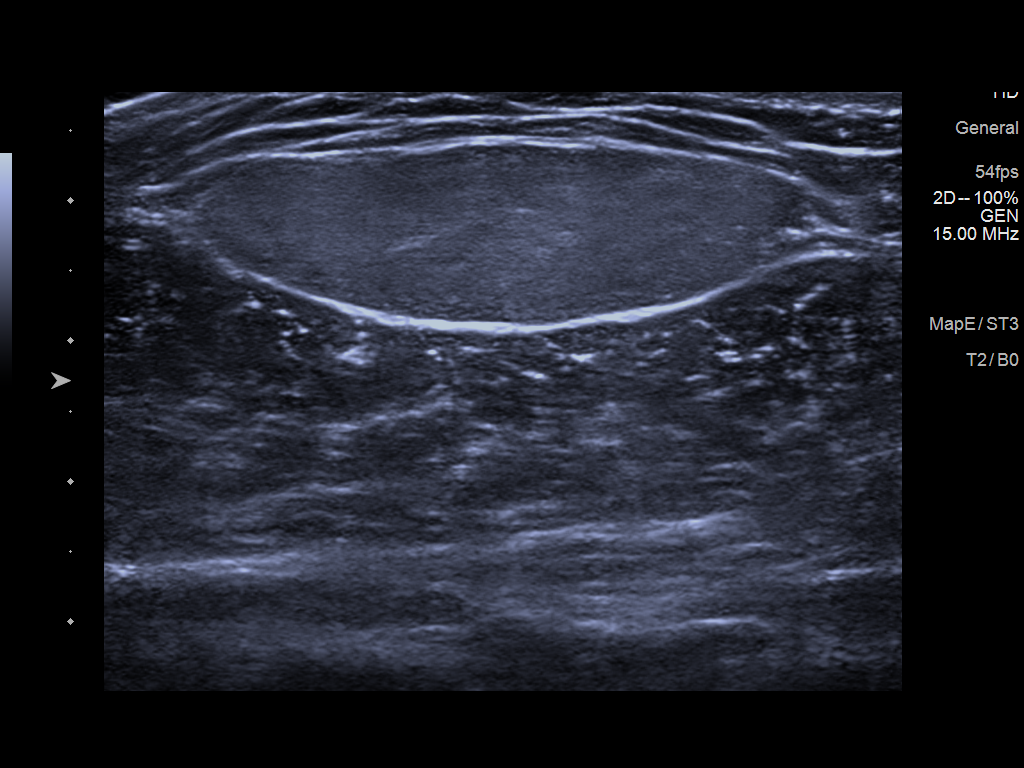
[im 2/5]
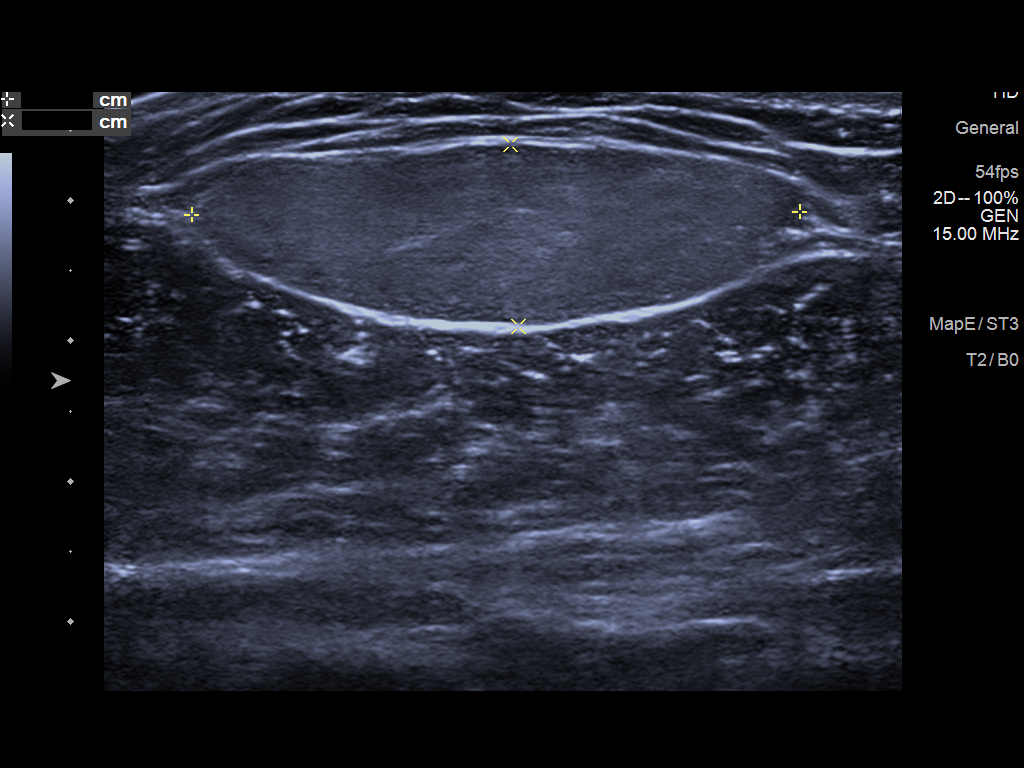
[im 3/5]
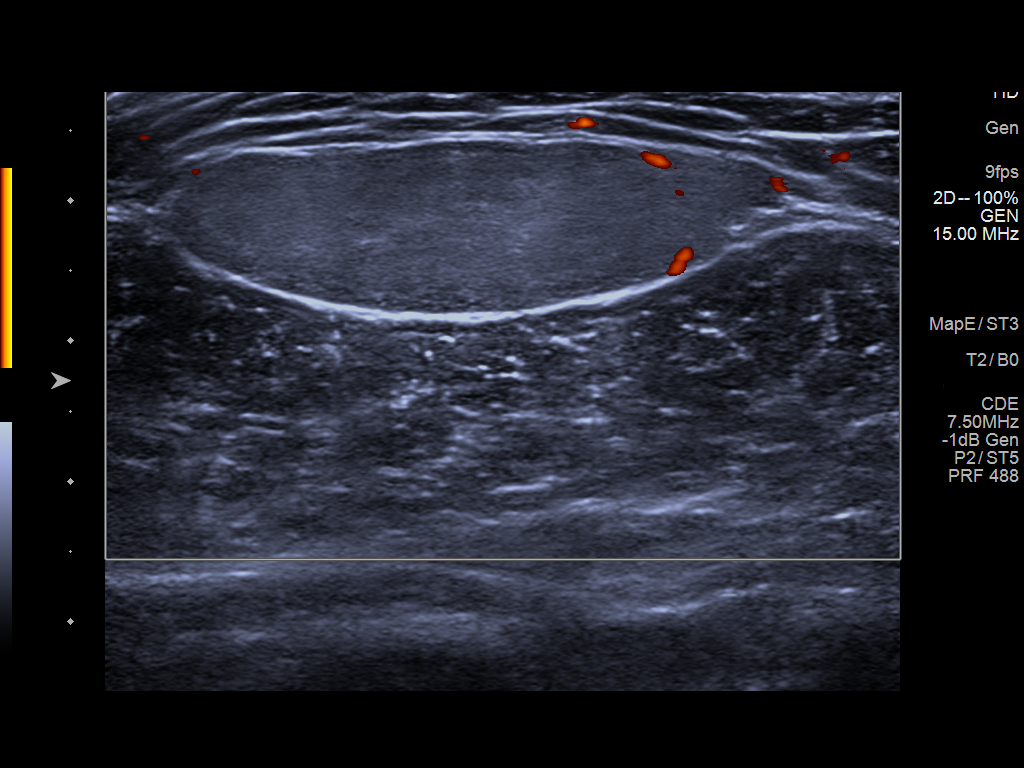
[im 4/5]
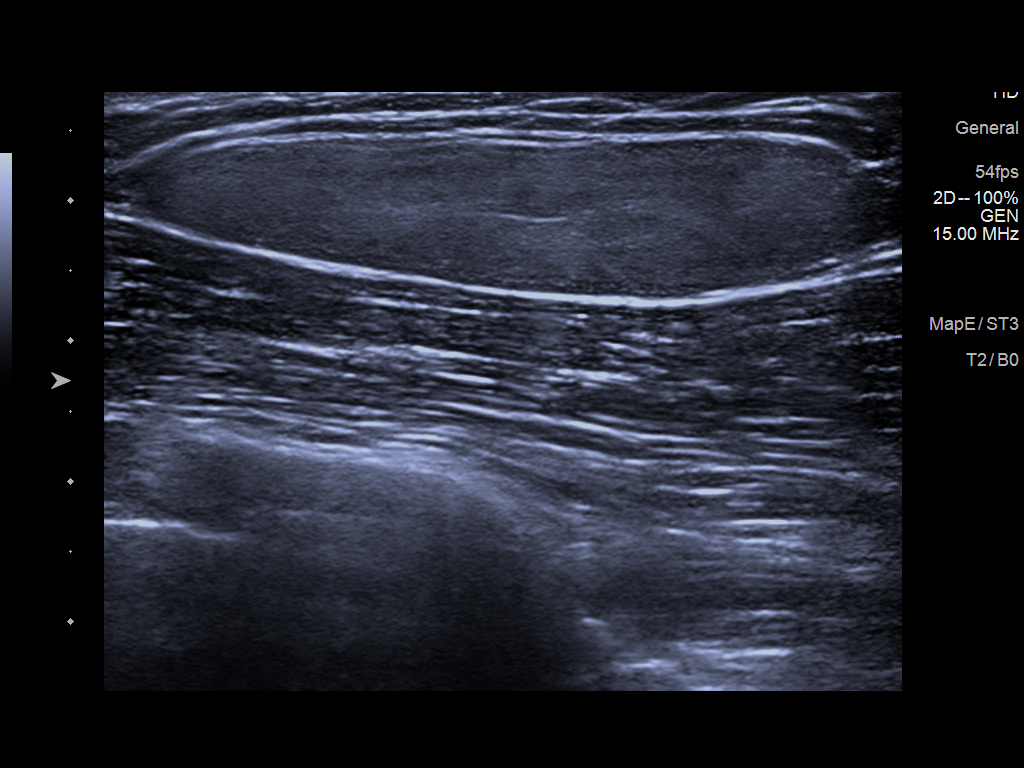
[im 5/5]
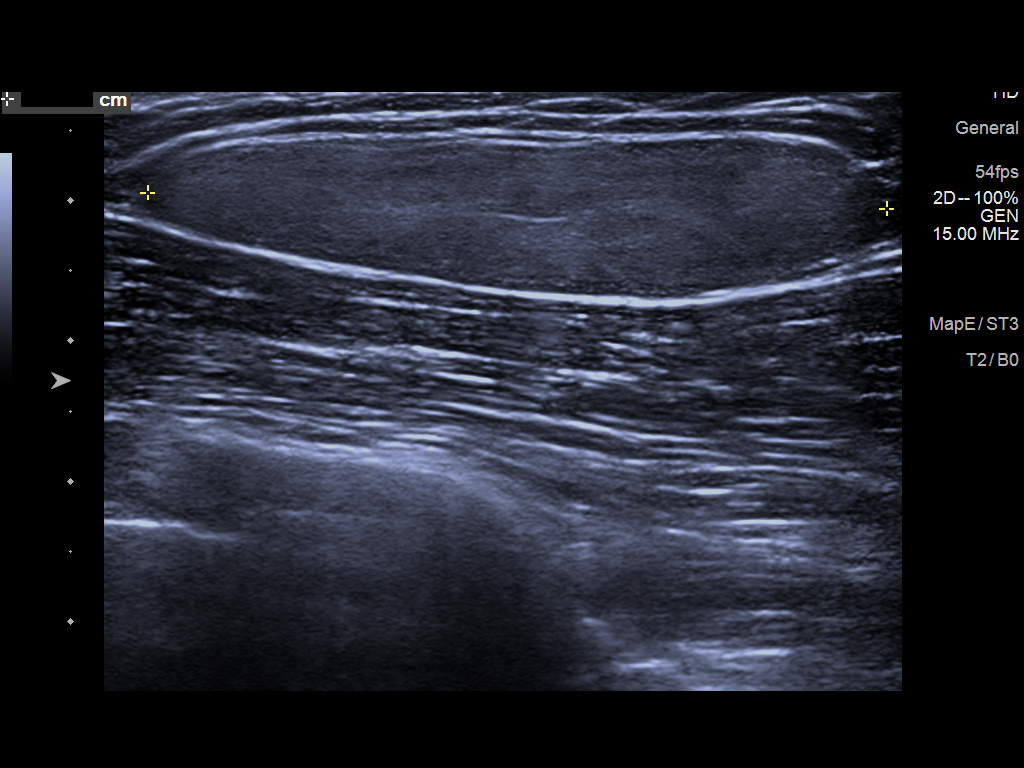

[5 of 5 positions shown; findings below may reference images not displayed]

ACR Breast Density Category b: There are scattered areas of
fibroglandular density.
FINDINGS: No suspicious mass, malignant type microcalcifications or distortion
detected in either breast. Spot tangential view of the area of
clinical concern in the upper inner quadrant of the left breast
shows a 4.7 cm area of encapsulated fat consistent with a lipoma.

Mammographic images were processed with CAD.

On physical exam, there is a soft palpable mass in the left breast
at 11 o'clock 7 cm from the nipple.

Targeted ultrasound is performed, showing a well-circumscribed
isoechoic mass in the left breast 11 o'clock 7 cm from the nipple
measuring 4.3 x 1.3 x 5.3 cm consistent with a benign lipoma.
IMPRESSION: Benign lipoma in the 11 o'clock region of the left breast accounting
for the palpable abnormality. No evidence of malignancy in either
breast.

RECOMMENDATION:
If the clinical exam remains benign/stable no follow-up is needed.

I have discussed the findings and recommendations with the patient.
Results were also provided in writing at the conclusion of the
visit. If applicable, a reminder letter will be sent to the patient
regarding the next appointment.

BI-RADS CATEGORY  2: Benign.

## 2020-04-06 IMAGING — MG DIGITAL DIAGNOSTIC BILATERAL MAMMOGRAM WITH TOMO AND CAD
6 of 10 series · 6 of 30 positions shown · non-contrast
Comparison: None.

CLINICAL DATA: 57-year-old male complains of a palpable abnormality
in the left breast.

EXAM:
DIGITAL DIAGNOSTIC BILATERAL MAMMOGRAM WITH CAD AND TOMO
ULTRASOUND LEFT BREAST

[R CC synth-2D]
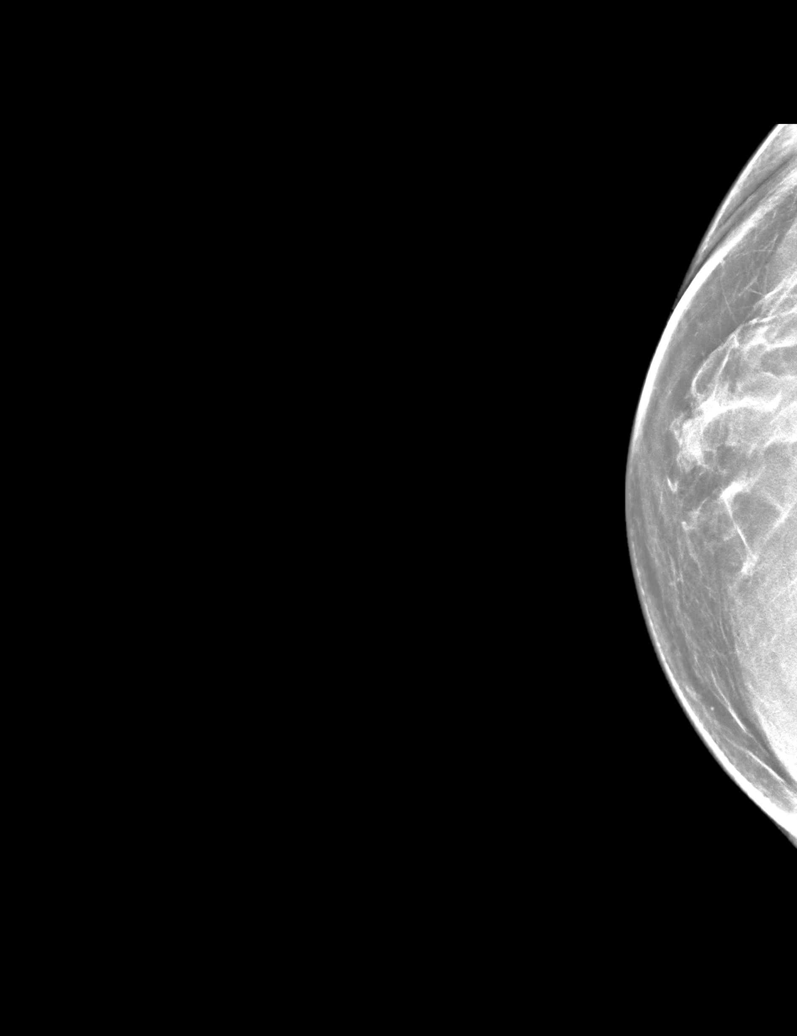

[L CC synth-2D]
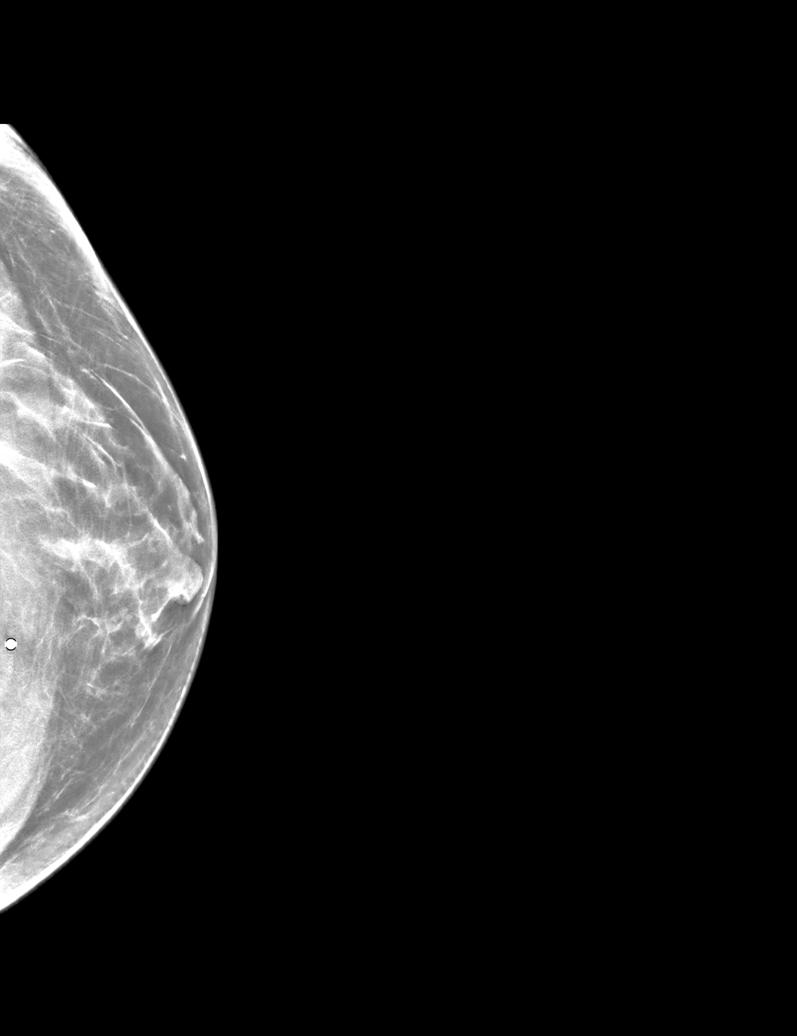

[L TAN synth-2D]
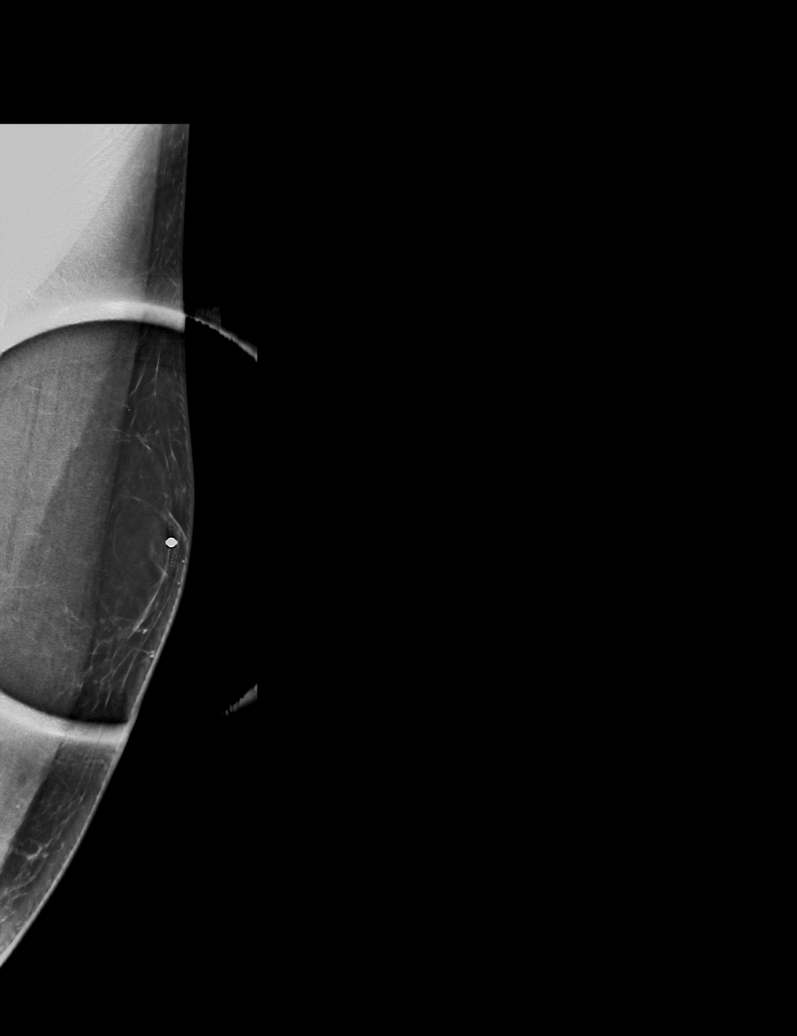

[R MLO synth-2D]
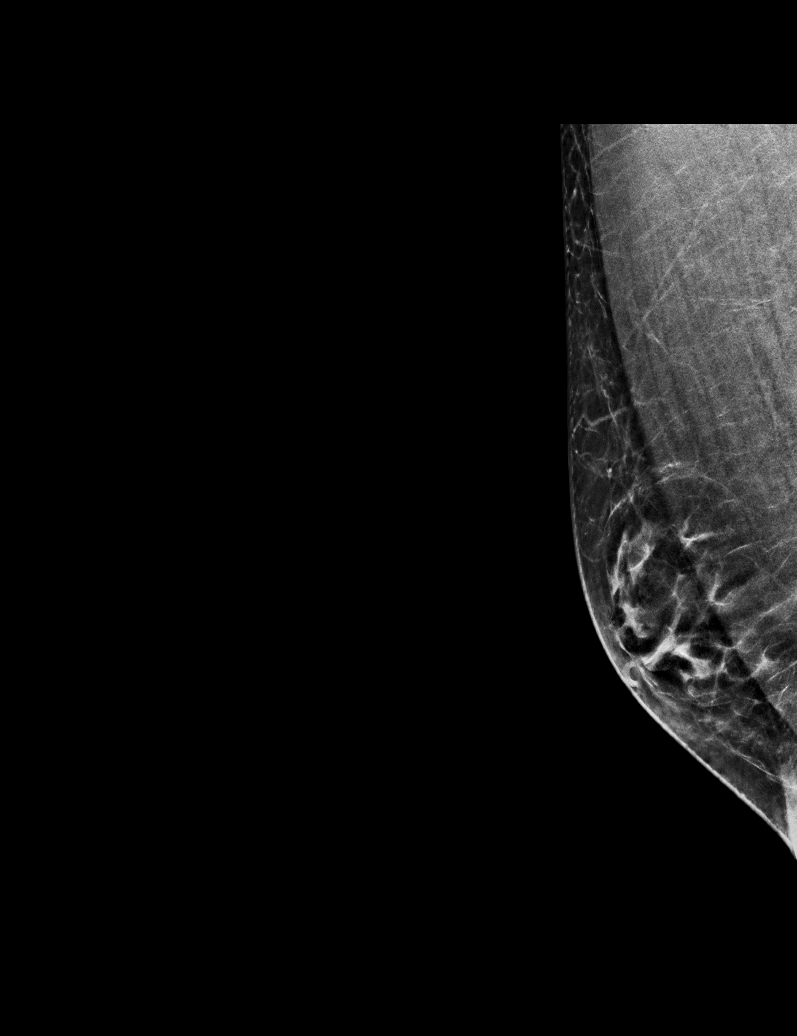

[L MLO synth-2D]
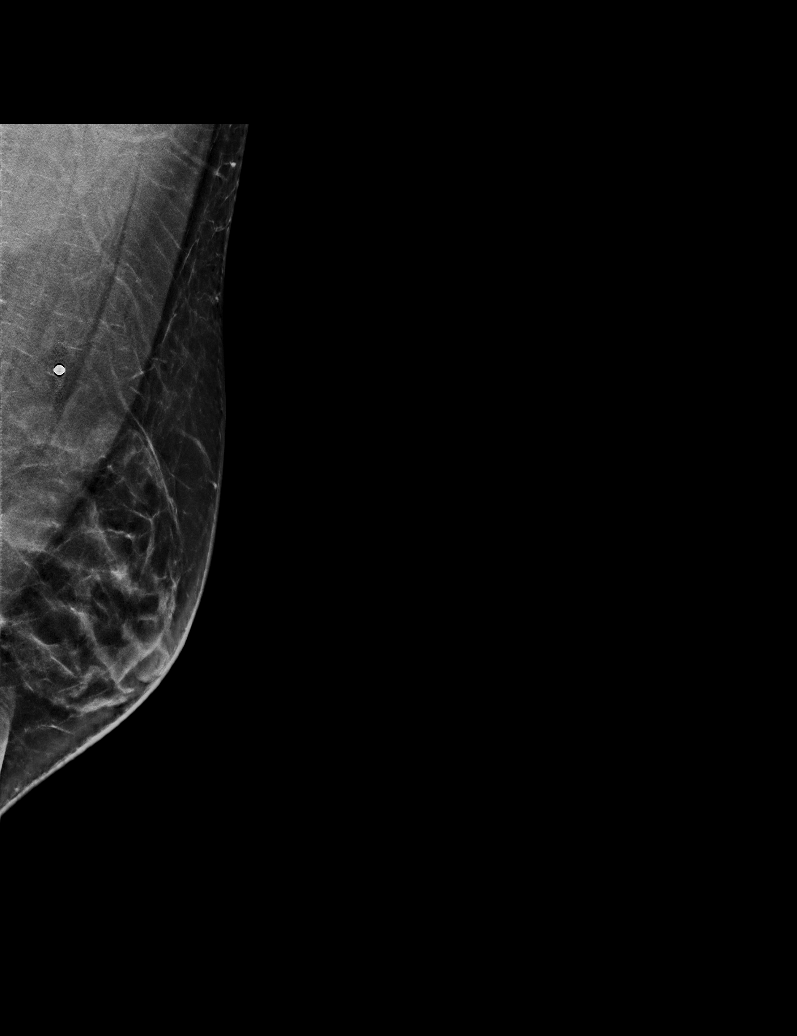

[L MLO tomo · tomo slice 31/61.0]
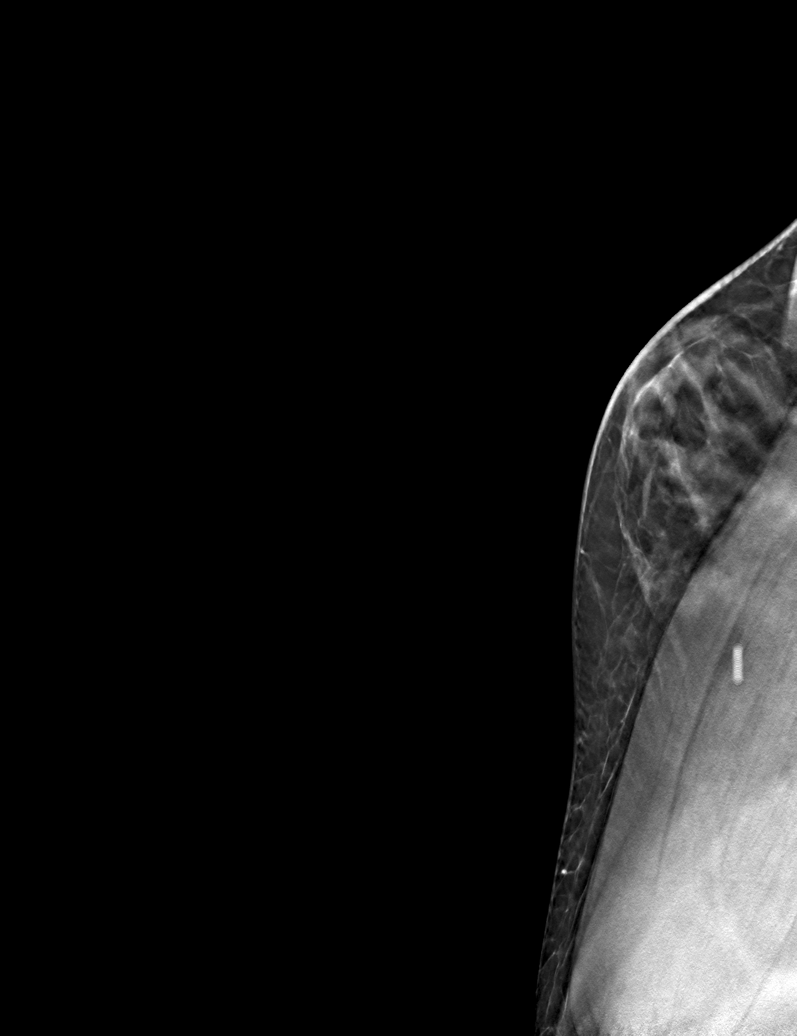

[6 of 30 positions shown; findings below may reference images not displayed]

ACR Breast Density Category b: There are scattered areas of
fibroglandular density.
FINDINGS: No suspicious mass, malignant type microcalcifications or distortion
detected in either breast. Spot tangential view of the area of
clinical concern in the upper inner quadrant of the left breast
shows a 4.7 cm area of encapsulated fat consistent with a lipoma.

Mammographic images were processed with CAD.

On physical exam, there is a soft palpable mass in the left breast
at 11 o'clock 7 cm from the nipple.

Targeted ultrasound is performed, showing a well-circumscribed
isoechoic mass in the left breast 11 o'clock 7 cm from the nipple
measuring 4.3 x 1.3 x 5.3 cm consistent with a benign lipoma.
IMPRESSION: Benign lipoma in the 11 o'clock region of the left breast accounting
for the palpable abnormality. No evidence of malignancy in either
breast.

RECOMMENDATION:
If the clinical exam remains benign/stable no follow-up is needed.

I have discussed the findings and recommendations with the patient.
Results were also provided in writing at the conclusion of the
visit. If applicable, a reminder letter will be sent to the patient
regarding the next appointment.

BI-RADS CATEGORY  2: Benign.

## 2022-06-02 ENCOUNTER — Ambulatory Visit (INDEPENDENT_AMBULATORY_CARE_PROVIDER_SITE_OTHER): Payer: BC Managed Care – PPO | Admitting: Podiatrist

## 2022-06-02 ENCOUNTER — Ambulatory Visit: Payer: BC Managed Care – PPO | Admitting: Podiatry

## 2022-06-02 ENCOUNTER — Encounter: Payer: Self-pay | Admitting: Podiatrist

## 2022-06-02 DIAGNOSIS — Z79899 Other long term (current) drug therapy: Secondary | ICD-10-CM | POA: Diagnosis not present

## 2022-06-02 DIAGNOSIS — L603 Nail dystrophy: Secondary | ICD-10-CM | POA: Diagnosis not present

## 2022-06-02 MED ORDER — TERBINAFINE HCL 250 MG PO TABS: 250.0000 mg | ORAL_TABLET | Freq: Every day | ORAL | 0 refills | Status: DC

## 2022-06-02 NOTE — Progress Notes (Signed)
  Chief Complaint  Patient presents with   Nail Problem    Hallux left - toenail thick and discolored x few years, did injury it 4 years ago, skin on the left foot also peels   New Patient (Initial Visit)     HPI: Patient is 62 y.o. male who presents today for peeling skin of the plantar feet as well as a thickened and discolored left hallux nail. He relates he has tried otc cream for the peeling with no relief.  He also relates he dropped a heavy weight on the toenail several years ago and the nail has been thickened for a while now.  He denies any liver or kidney issues in the past and is currently not taking any medications.   Patient Active Problem List   Diagnosis Date Noted   Routine general medical examination at a health care facility 03/12/2011   GERD (gastroesophageal reflux disease)     No current outpatient medications on file prior to visit.   No current facility-administered medications on file prior to visit.    No Known Allergies  Review of Systems No fevers, chills, nausea, muscle aches, no difficulty breathing, no calf pain, no chest pain or shortness of breath.   Physical Exam  GENERAL APPEARANCE: Alert, conversant. Appropriately groomed. No acute distress.   VASCULAR: Pedal pulses palpable 2/4 DP and  PT bilateral.  Capillary refill time is immediate to all digits,  Proximal to distal cooling is warm to warm.  Digital perfusion adequate.   NEUROLOGIC: sensation is intact to 5.07 monofilament at 5/5 sites bilateral.  Light touch is intact bilateral, vibratory sensation intact bilateral  MUSCULOSKELETAL: acceptable muscle strength, tone and stability bilateral.  No gross boney pedal deformities noted.  No pain, crepitus or limitation noted with foot and ankle range of motion bilateral.   DERMATOLOGIC: skin is warm, supple, and dry.  Peeling skin on the bottoms of both feet noted in a moccasin like distribution.  Left hallux nail is thick and dystrophic with  subungual debris present.      Assessment     ICD-10-CM   1. Nail dystrophy  L60.3 Fungus Culture with Smear    2. Encounter for long-term current use of medication  Z79.899 Comprehensive metabolic panel       Plan  Discussed treatment options and alternatives.  Discussed lamisil therapy for the skin and nail.  Took a sample of the hallux nail to confirm diagnosis.  Comprehensive metabolic panel ordered and a 30 day rx of lamisil was written.  He will return in 30 days for a recheck and probable refill of lamisil x 60 days.  He will call sooner if any concerns arise.

## 2022-06-03 LAB — COMPREHENSIVE METABOLIC PANEL
ALT: 23 IU/L (ref 0–44)
AST: 22 IU/L (ref 0–40)
Albumin/Globulin Ratio: 1.6 (ref 1.2–2.2)
Albumin: 4.4 g/dL (ref 3.8–4.9)
Alkaline Phosphatase: 111 IU/L (ref 44–121)
BUN/Creatinine Ratio: 20 (ref 9–20)
BUN: 16 mg/dL (ref 6–24)
Bilirubin Total: 0.3 mg/dL (ref 0.0–1.2)
CO2: 23 mmol/L (ref 20–29)
Calcium: 9 mg/dL (ref 8.7–10.2)
Chloride: 103 mmol/L (ref 96–106)
Creatinine, Ser: 0.82 mg/dL (ref 0.76–1.27)
Globulin, Total: 2.7 g/dL (ref 1.5–4.5)
Glucose: 110 mg/dL — ABNORMAL HIGH (ref 70–99)
Potassium: 4.3 mmol/L (ref 3.5–5.2)
Sodium: 141 mmol/L (ref 134–144)
Total Protein: 7.1 g/dL (ref 6.0–8.5)
eGFR: 101 mL/min/{1.73_m2} (ref >59)

## 2022-06-24 ENCOUNTER — Encounter: Payer: Self-pay | Admitting: Podiatry

## 2022-06-30 ENCOUNTER — Encounter: Payer: Self-pay | Admitting: Podiatry

## 2022-06-30 ENCOUNTER — Ambulatory Visit (INDEPENDENT_AMBULATORY_CARE_PROVIDER_SITE_OTHER): Payer: BC Managed Care – PPO | Admitting: Podiatry

## 2022-06-30 DIAGNOSIS — L603 Nail dystrophy: Secondary | ICD-10-CM | POA: Diagnosis not present

## 2022-06-30 DIAGNOSIS — Z79899 Other long term (current) drug therapy: Secondary | ICD-10-CM

## 2022-06-30 MED ORDER — TERBINAFINE HCL 250 MG PO TABS: 250.0000 mg | ORAL_TABLET | Freq: Every day | ORAL | 0 refills | Status: AC

## 2022-06-30 NOTE — Progress Notes (Signed)
She presents today for follow-up of his nail fungus is completed his first 30 days of Lamisil with no problems.  Denies fever chills nausea vomit muscle aches pains calf pain back pain chest pain shortness of breath.  Objective: Vital signs are stable alert and oriented x 3.  Pathology does demonstrate onychomycosis.  Assessment: Long-term therapy with Lamisil for onychomycosis.  Plan: Discussed etiology pathology and surgical therapies at this point had more blood work performed with a CMP and he will complete his first 30 days in total and then start his next 90 tablets which I am sending in for him today.

## 2022-10-27 ENCOUNTER — Ambulatory Visit: Payer: BC Managed Care – PPO | Admitting: Podiatry

## 2022-11-24 ENCOUNTER — Ambulatory Visit: Payer: BC Managed Care – PPO | Admitting: Podiatry
